# Patient Record
Sex: Female | Born: 1985 | Race: White | Hispanic: No | Marital: Married | Smoking: Never smoker
Health system: Southern US, Community
[De-identification: ages and names within clinical notes are randomized; demographics above are authoritative.]

## PROBLEM LIST (undated history)

## (undated) DIAGNOSIS — G43909 Migraine, unspecified, not intractable, without status migrainosus: Secondary | ICD-10-CM

## (undated) DIAGNOSIS — F329 Major depressive disorder, single episode, unspecified: Secondary | ICD-10-CM

## (undated) DIAGNOSIS — J45909 Unspecified asthma, uncomplicated: Secondary | ICD-10-CM

## (undated) DIAGNOSIS — Z8632 Personal history of gestational diabetes: Secondary | ICD-10-CM

## (undated) DIAGNOSIS — M1990 Unspecified osteoarthritis, unspecified site: Secondary | ICD-10-CM

## (undated) DIAGNOSIS — F32A Depression, unspecified: Secondary | ICD-10-CM

## (undated) HISTORY — PX: NO PAST SURGERIES: SHX2092

## (undated) HISTORY — DX: Unspecified osteoarthritis, unspecified site: M19.90

## (undated) HISTORY — DX: Unspecified asthma, uncomplicated: J45.909

## (undated) HISTORY — DX: Depression, unspecified: F32.A

## (undated) HISTORY — DX: Migraine, unspecified, not intractable, without status migrainosus: G43.909

## (undated) HISTORY — DX: Personal history of gestational diabetes: Z86.32

---
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
## 2019-11-30 NOTE — L&D Delivery Note (Signed)
Patient: Nicole Noble MRN: [id number]  GBS status: Neg, IAP given: None   Patient is a 34 y.o. now G4P3 s/p NSVD at [redacted]w[redacted]d, who was admitted for IOL for Polyhydramnios. AROM 1h 20m prior to delivery with clear fluid.    Delivery Note At 11:27 AM a viable female was delivered via Vaginal, Spontaneous (Presentation: Left Occiput Anterior).  APGAR: 9, 9; weight pending.   Placenta status: Spontaneous, Intact.  Cord: 3 vessels with the following complications: None.    Anesthesia: Epidural Episiotomy: None Lacerations: 1st degree;Perineal Suture Repair: 3.0 vicryl rapide Est. Blood Loss (mL):  264   Head delivered LOA with compound hand. No nuchal cord present. Shoulder and body delivered in usual fashion. Infant with spontaneous cry, placed on mother's abdomen, dried and bulb suctioned. Cord clamped x 2 after 1-minute delay, and cut by family member. Cord blood drawn. Placenta delivered spontaneously with gentle cord traction. Fundus firm with massage and Pitocin. Perineum inspected and found to have 1st degree laceration, which was repaired with 3.0 vicryl rapide with good hemostasis achieved.  Mom to postpartum.  Baby to Couplet care / Skin to Skin.  De Hollingshead 07/09/2020, 11:49 AM

## 2020-04-15 ENCOUNTER — Encounter: Payer: Self-pay | Admitting: *Deleted

## 2020-04-22 ENCOUNTER — Encounter: Payer: Self-pay | Admitting: Obstetrics & Gynecology

## 2020-04-22 DIAGNOSIS — Z3480 Encounter for supervision of other normal pregnancy, unspecified trimester: Secondary | ICD-10-CM | POA: Insufficient documentation

## 2020-04-24 ENCOUNTER — Ambulatory Visit (INDEPENDENT_AMBULATORY_CARE_PROVIDER_SITE_OTHER): Payer: Self-pay | Admitting: Obstetrics & Gynecology

## 2020-04-24 ENCOUNTER — Other Ambulatory Visit: Payer: Self-pay

## 2020-04-24 ENCOUNTER — Encounter: Payer: Self-pay | Admitting: Obstetrics & Gynecology

## 2020-04-24 VITALS — BP 115/66 | HR 70 | Ht 60.0 in | Wt 169.0 lb

## 2020-04-24 DIAGNOSIS — Z23 Encounter for immunization: Secondary | ICD-10-CM

## 2020-04-24 DIAGNOSIS — Z3480 Encounter for supervision of other normal pregnancy, unspecified trimester: Secondary | ICD-10-CM

## 2020-04-24 DIAGNOSIS — Z3A28 28 weeks gestation of pregnancy: Secondary | ICD-10-CM

## 2020-04-24 DIAGNOSIS — Z3483 Encounter for supervision of other normal pregnancy, third trimester: Secondary | ICD-10-CM

## 2020-04-24 MED ORDER — ASPIRIN EC 81 MG PO TBEC: 81.0000 mg | DELAYED_RELEASE_TABLET | Freq: Every day | ORAL | 2 refills | Status: DC

## 2020-04-24 NOTE — Progress Notes (Signed)
   PRENATAL VISIT NOTE  Subjective:  Nicole Noble is a 34 y.o. [id number] at [redacted]w[redacted]d being seen today for transfer of prenatal care from [place].  No issues so far this pregnany, history of GDM.  Had normal early GTT.  Had two term SVDs. She is currently monitored for the following issues for this low-risk pregnancy and has Supervision of other normal pregnancy, antepartum on their problem list.  Patient reports no complaints.  Contractions: Not present. Vag. Bleeding: None.  Movement: Present. Denies leaking of fluid.   The following portions of the patient's history were reviewed and updated as appropriate: allergies, current medications, past family history, past medical history, past social history, past surgical history and problem list.   Objective:   Vitals:   04/22/20 1557 04/24/20 1411  BP:  115/66  Pulse:  70  Weight:  169 lb (76.7 kg)  Height: 5' (1.524 m)     Fetal Status: Fetal Heart Rate (bpm): 145   Movement: Present     General:  Alert, oriented and cooperative. Patient is in no acute distress.  Skin: Skin is warm and dry. No rash noted.   Cardiovascular: Normal heart rate noted  Respiratory: Normal respiratory effort, no problems with respiration noted  Abdomen: Soft, gravid, appropriate for gestational age.  Pain/Pressure: Present     Pelvic: Cervical exam deferred        Extremities: Normal range of motion.  Edema: None  Mental Status: Normal mood and affect. Normal behavior. Normal judgment and thought content.   Assessment and Plan:  Pregnancy: G4P2012 at [redacted]w[redacted]d 1. [redacted] weeks gestation of pregnancy 2. Supervision of other normal pregnancy, antepartum - Tdap vaccine greater than or equal to 7yo IM - aspirin EC 81 MG tablet; Take 1 tablet (81 mg total) by mouth daily. Take after 12 weeks for prevention of preeclampsia later in pregnancy  Dispense: 300 tablet; Refill: 2 - She is interested in waterbirth, information given to her. Will discuss with CNM next visit.  -  The nature of [facility] with multiple MDs and other Advanced Practice Providers was explained to patient; also emphasized that residents, students are part of our team.  Preterm labor symptoms and general obstetric precautions including but not limited to vaginal bleeding, contractions, leaking of fluid and fetal movement were reviewed in detail with the patient. Please refer to After Visit Summary for other counseling recommendations.   Return in about 2 weeks (around 05/08/2020) for 2 hr GTT, 3rd trimester labs, OFFICE OB Visit with CNM to discuss waterbirth.  Future Appointments  Date Time Provider Department Center  05/13/2020 11:15 AM Leftwich-Kirby, Wilmer Floor, CNM [facility]    Jaynie Collins, MD

## 2020-04-24 NOTE — Patient Instructions (Signed)
Return to office for any scheduled appointments. Call the office or go to the MAU at [facility] if:  You begin to have strong, frequent contractions  Your water breaks.  Sometimes it is a big gush of fluid, sometimes it is just a trickle that keeps getting your panties wet or running down your legs  You have vaginal bleeding.  It is normal to have a small amount of spotting if your cervix was checked.   You do not feel your baby moving like normal.  If you do not, get something to eat and drink and lay down and focus on feeling your baby move.   If your baby is still not moving like normal, you should call the office or go to MAU.  Any other obstetric concerns.  Considering Waterbirth? Guide for patients at [facility] Why consider waterbirth? . Gentle birth for babies  . Less pain medicine used in labor  . May allow for passive descent/less pushing  . May reduce perineal tears  . More mobility and instinctive maternal position changes  . Increased maternal relaxation  . Reduced blood pressure in labor   Is waterbirth safe? What are the risks of infection, drowning or other complications? . Infection:  Marland Kitchen Very low risk (3.7 % for tub vs 4.8% for bed)  . 7 in 8000 waterbirths with documented infection  . Poorly cleaned equipment most common cause  . Slightly lower group B strep transmission rate  . Drowning  . Maternal:  . Very low risk  . Related to seizures or fainting  . Newborn:  Marland Kitchen Very low risk. No evidence of increased risk of respiratory problems in multiple large studies  . Physiological protection from breathing under water  . Avoid underwater birth if there are any fetal complications  . Once baby's head is out of the water, keep it out.  . Birth complication  . Some reports of cord trauma, but risk decreased by bringing baby to surface gradually  . No evidence of increased risk of shoulder dystocia. Mothers can usually  change positions faster in water than in a bed, possibly aiding the maneuvers to free the shoulder.  ? You must attend a Waterbirth class at [employer] at [facility] . 3rd Wednesday of every month from 7-9pm  . Free  . Register by calling [phone] or online at [email or web address]  . Bring [place] the certificate from the class to your prenatal appointment  Meet with a midwife at 36 weeks to see if you can still plan a waterbirth and to sign the consent.   If you plan a waterbirth at [facility] at [facility], you can opt to purchase the following: . Fish Net . Bathing suit top (optional)  . Long-handled mirror (optional)  .  Things that would prevent you from having a waterbirth: . Unknown or Positive COVID-19 diagnosis upon admission to hospital  . Premature, <37wks  . Previous cesarean birth  . Presence of thick meconium-stained fluid  . Multiple gestation (Twins, triplets, etc.)  . Uncontrolled diabetes or gestational diabetes requiring medication  . Hypertension requiring medication or diagnosis of pre-eclampsia  . Heavy vaginal bleeding  . Non-reassuring fetal heart rate  . Active infection (MRSA, etc.). Group B Strep is NOT a contraindication for waterbirth.  . If your labor has to be induced and induction method requires continuous monitoring of the baby's heart rate  . Other risks/issues  identified by your obstetrical provider  Please remember that birth is unpredictable. Under certain unforeseeable circumstances your provider may advise against giving birth in the tub. These decisions will be made on a case-by-case basis and with the safety of you and your baby as our highest priority.  **Please remember that in order to have a waterbirth, you must test Negative to COVID-19 upon admission to the hospital.**  Third Trimester of Pregnancy The third trimester is from week 28 through week 40 (months 7 through 9). The third trimester is  a time when the unborn baby (fetus) is growing rapidly. At the end of the ninth month, the fetus is about 20 inches in length and weighs 6-10 pounds. Body changes during your third trimester Your body will continue to go through many changes during pregnancy. The changes vary from woman to woman. During the third trimester:  Your weight will continue to increase. You can expect to gain 25-35 pounds (11-16 kg) by the end of the pregnancy.  You may begin to get stretch marks on your hips, abdomen, and breasts.  You may urinate more often because the fetus is moving lower into your pelvis and pressing on your bladder.  You may develop or continue to have heartburn. This is caused by increased hormones that slow down muscles in the digestive tract.  You may develop or continue to have constipation because increased hormones slow digestion and cause the muscles that push waste through your intestines to relax.  You may develop hemorrhoids. These are swollen veins (varicose veins) in the rectum that can itch or be painful.  You may develop swollen, bulging veins (varicose veins) in your legs.  You may have increased body aches in the pelvis, back, or thighs. This is due to weight gain and increased hormones that are relaxing your joints.  You may have changes in your hair. These can include thickening of your hair, rapid growth, and changes in texture. Some women also have hair loss during or after pregnancy, or hair that feels dry or thin. Your hair will most likely return to normal after your baby is born.  Your breasts will continue to grow and they will continue to become tender. A yellow fluid (colostrum) may leak from your breasts. This is the first milk you are producing for your baby.  Your belly button may stick out.  You may notice more swelling in your hands, face, or ankles.  You may have increased tingling or numbness in your hands, arms, and legs. The skin on your belly may also  feel numb.  You may feel short of breath because of your expanding uterus.  You may have more problems sleeping. This can be caused by the size of your belly, increased need to urinate, and an increase in your body's metabolism.  You may notice the fetus "dropping," or moving lower in your abdomen (lightening).  You may have increased vaginal discharge.  You may notice your joints feel loose and you may have pain around your pelvic bone. What to expect at prenatal visits You will have prenatal exams every 2 weeks until week 36. Then you will have weekly prenatal exams. During a routine prenatal visit:  You will be weighed to make sure you and the baby are growing normally.  Your blood pressure will be taken.  Your abdomen will be measured to track your baby's growth.  The fetal heartbeat will be listened to.  Any test results from the previous visit will be  discussed.  You may have a cervical check near your due date to see if your cervix has softened or thinned (effaced).  You will be tested for Group B streptococcus. This happens between 35 and 37 weeks. Your health care provider may ask you:  What your birth plan is.  How you are feeling.  If you are feeling the baby move.  If you have had any abnormal symptoms, such as leaking fluid, bleeding, severe headaches, or abdominal cramping.  If you are using any tobacco products, including cigarettes, chewing tobacco, and electronic cigarettes.  If you have any questions. Other tests or screenings that may be performed during your third trimester include:  Blood tests that check for low iron levels (anemia).  Fetal testing to check the health, activity level, and growth of the fetus. Testing is done if you have certain medical conditions or if there are problems during the pregnancy.  Nonstress test (NST). This test checks the health of your baby to make sure there are no signs of problems, such as the baby not getting  enough oxygen. During this test, a belt is placed around your belly. The baby is made to move, and its heart rate is monitored during movement. What is false labor? False labor is a condition in which you feel small, irregular tightenings of the muscles in the womb (contractions) that usually go away with rest, changing position, or drinking water. These are called Braxton Hicks contractions. Contractions may last for hours, days, or even weeks before true labor sets in. If contractions come at regular intervals, become more frequent, increase in intensity, or become painful, you should see your health care provider. What are the signs of labor?  Abdominal cramps.  Regular contractions that start at 10 minutes apart and become stronger and more frequent with time.  Contractions that start on the top of the uterus and spread down to the lower abdomen and back.  Increased pelvic pressure and dull back pain.  A watery or bloody mucus discharge that comes from the vagina.  Leaking of amniotic fluid. This is also known as your "water breaking." It could be a slow trickle or a gush. Let your health care provider know if it has a color or strange odor. If you have any of these signs, call your health care provider right away, even if it is before your due date. Follow these instructions at home: Medicines  Follow your health care provider's instructions regarding medicine use. Specific medicines may be either safe or unsafe to take during pregnancy.  Take a prenatal vitamin that contains at least 600 micrograms (mcg) of folic acid.  If you develop constipation, try taking a stool softener if your health care provider approves. Eating and drinking   Eat a balanced diet that includes fresh fruits and vegetables, whole grains, good sources of protein such as meat, eggs, or tofu, and low-fat dairy. Your health care provider will help you determine the amount of weight gain that is right for  you.  Avoid raw meat and uncooked cheese. These carry germs that can cause birth defects in the baby.  If you have low calcium intake from food, talk to your health care provider about whether you should take a daily calcium supplement.  Eat four or five small meals rather than three large meals a day.  Limit foods that are high in fat and processed sugars, such as fried and sweet foods.  To prevent constipation: ? Drink enough fluid to  keep your urine clear or pale yellow. ? Eat foods that are high in fiber, such as fresh fruits and vegetables, whole grains, and beans. Activity  Exercise only as directed by your health care provider. Most women can continue their usual exercise routine during pregnancy. Try to exercise for 30 minutes at least 5 days a week. Stop exercising if you experience uterine contractions.  Avoid heavy lifting.  Do not exercise in extreme heat or humidity, or at high altitudes.  Wear low-heel, comfortable shoes.  Practice good posture.  You may continue to have sex unless your health care provider tells you otherwise. Relieving pain and discomfort  Take frequent breaks and rest with your legs elevated if you have leg cramps or low back pain.  Take warm sitz baths to soothe any pain or discomfort caused by hemorrhoids. Use hemorrhoid cream if your health care provider approves.  Wear a good support bra to prevent discomfort from breast tenderness.  If you develop varicose veins: ? Wear support pantyhose or compression stockings as told by your healthcare provider. ? Elevate your feet for 15 minutes, 3-4 times a day. Prenatal care  Write down your questions. Take them to your prenatal visits.  Keep all your prenatal visits as told by your health care provider. This is important. Safety  Wear your seat belt at all times when driving.  Make a list of emergency phone numbers, including numbers for family, friends, the hospital, and police and fire  departments. General instructions  Avoid cat litter boxes and soil used by cats. These carry germs that can cause birth defects in the baby. If you have a cat, ask someone to clean the litter box for you.  Do not travel far distances unless it is absolutely necessary and only with the approval of your health care provider.  Do not use hot tubs, steam rooms, or saunas.  Do not drink alcohol.  Do not use any products that contain nicotine or tobacco, such as cigarettes and e-cigarettes. If you need help quitting, ask your health care provider.  Do not use any medicinal herbs or unprescribed drugs. These chemicals affect the formation and growth of the baby.  Do not douche or use tampons or scented sanitary pads.  Do not cross your legs for long periods of time.  To prepare for the arrival of your baby: ? Take prenatal classes to understand, practice, and ask questions about labor and delivery. ? Make a trial run to the hospital. ? Visit the hospital and tour the maternity area. ? Arrange for maternity or paternity leave through employers. ? Arrange for family and friends to take care of pets while you are in the hospital. ? Purchase a rear-facing car seat and make sure you know how to install it in your car. ? Pack your hospital bag. ? Prepare the baby's nursery. Make sure to remove all pillows and stuffed animals from the baby's crib to prevent suffocation.  Visit your dentist if you have not gone during your pregnancy. Use a soft toothbrush to brush your teeth and be gentle when you floss. Contact a health care provider if:  You are unsure if you are in labor or if your water has broken.  You become dizzy.  You have mild pelvic cramps, pelvic pressure, or nagging pain in your abdominal area.  You have lower back pain.  You have persistent nausea, vomiting, or diarrhea.  You have an unusual or bad smelling vaginal discharge.  You have pain  when you urinate. Get help right  away if:  Your water breaks before 37 weeks.  You have regular contractions less than 5 minutes apart before 37 weeks.  You have a fever.  You are leaking fluid from your vagina.  You have spotting or bleeding from your vagina.  You have severe abdominal pain or cramping.  You have rapid weight loss or weight gain.  You have shortness of breath with chest pain.  You notice sudden or extreme swelling of your face, hands, ankles, feet, or legs.  Your baby makes fewer than 10 movements in 2 hours.  You have severe headaches that do not go away when you take medicine.  You have vision changes. Summary  The third trimester is from week 28 through week 40, months 7 through 9. The third trimester is a time when the unborn baby (fetus) is growing rapidly.  During the third trimester, your discomfort may increase as you and your baby continue to gain weight. You may have abdominal, leg, and back pain, sleeping problems, and an increased need to urinate.  During the third trimester your breasts will keep growing and they will continue to become tender. A yellow fluid (colostrum) may leak from your breasts. This is the first milk you are producing for your baby.  False labor is a condition in which you feel small, irregular tightenings of the muscles in the womb (contractions) that eventually go away. These are called Braxton Hicks contractions. Contractions may last for hours, days, or even weeks before true labor sets in.  Signs of labor can include: abdominal cramps; regular contractions that start at 10 minutes apart and become stronger and more frequent with time; watery or bloody mucus discharge that comes from the vagina; increased pelvic pressure and dull back pain; and leaking of amniotic fluid. This information is not intended to replace advice given to you by your health care provider. Make sure you discuss any questions you have with your health care provider. Document Revised:  03/08/2019 Document Reviewed: 12/21/2016 Elsevier Patient Education  [place].

## 2020-05-13 ENCOUNTER — Ambulatory Visit (INDEPENDENT_AMBULATORY_CARE_PROVIDER_SITE_OTHER): Payer: 59 | Admitting: Advanced Practice Midwife

## 2020-05-13 ENCOUNTER — Other Ambulatory Visit: Payer: Self-pay

## 2020-05-13 VITALS — BP 111/65 | HR 88 | Wt 170.0 lb

## 2020-05-13 DIAGNOSIS — Z3480 Encounter for supervision of other normal pregnancy, unspecified trimester: Secondary | ICD-10-CM

## 2020-05-13 DIAGNOSIS — Z3483 Encounter for supervision of other normal pregnancy, third trimester: Secondary | ICD-10-CM

## 2020-05-13 DIAGNOSIS — Z3A30 30 weeks gestation of pregnancy: Secondary | ICD-10-CM

## 2020-05-13 NOTE — Patient Instructions (Signed)
Considering Waterbirth? °Guide for patients at [facility] °Why consider waterbirth? °• Gentle birth for babies  °• Less pain medicine used in labor  °• May allow for passive descent/less pushing  °• May reduce perineal tears  °• More mobility and instinctive maternal position changes  °• Increased maternal relaxation  °• Reduced blood pressure in labor  ° °Is waterbirth safe? What are the risks of infection, drowning or other complications? °• Infection:  °• Very low risk (3.7 % for tub vs 4.8% for bed)  °• 7 in 8000 waterbirths with documented infection  °• Poorly cleaned equipment most common cause  °• Slightly lower group B strep transmission rate  °• Drowning  °• Maternal:  °• Very low risk  °• Related to seizures or fainting  °• Newborn:  °• Very low risk. No evidence of increased risk of respiratory problems in multiple large studies  °• Physiological protection from breathing under water  °• Avoid underwater birth if there are any fetal complications  °• Once baby's head is out of the water, keep it out.  °• Birth complication  °• Some reports of cord trauma, but risk decreased by bringing baby to surface gradually  °• No evidence of increased risk of shoulder dystocia. Mothers can usually change positions faster in water than in a bed, possibly aiding the maneuvers to free the shoulder.  °? °You must attend a Waterbirth class at [facility] [place] °• 3rd Wednesday of every month from 7-9pm  °• Free  °• Register by calling [phone] or online at [email or web address]  °• Bring us the certificate from the class to your prenatal appointment  °Meet with a midwife at 36 weeks to see if you can still plan a waterbirth and to sign the consent.  ° °If you plan a waterbirth at [facility] , you can opt to purchase the following: °• Fish Net °• Bathing suit top (optional)  °• Long-handled mirror (optional)  °•  °Things that would  prevent you from having a waterbirth: °• Unknown or Positive COVID-19 diagnosis upon admission to hospital  °• Premature, <37wks  °• Previous cesarean birth  °• Presence of thick meconium-stained fluid  °• Multiple gestation (Twins, triplets, etc.)  °• Uncontrolled diabetes or gestational diabetes requiring medication  °• Hypertension requiring medication or diagnosis of pre-eclampsia  °• Heavy vaginal bleeding  °• Non-reassuring fetal heart rate  °• Active infection (MRSA, etc.). Group B Strep is NOT a contraindication for waterbirth.  °• If your labor has to be induced and induction method requires continuous monitoring of the baby's heart rate  °• Other risks/issues identified by your obstetrical provider  °Please remember that birth is unpredictable. Under certain unforeseeable circumstances your provider may advise against giving birth in the tub. These decisions will be made on a case-by-case basis and with the safety of you and your baby as our highest priority. ° °**Please remember that in order to have a waterbirth, you must test Negative to COVID-19 upon admission to the hospital.** ° °

## 2020-05-13 NOTE — Progress Notes (Signed)
   PRENATAL VISIT NOTE  Subjective:  Nicole Noble is a 34 y.o. [id number] at [redacted]w[redacted]d being seen today for ongoing prenatal care.  She is currently monitored for the following issues for this low-risk pregnancy and has Supervision of other normal pregnancy, antepartum on their problem list.  Patient reports no complaints.  Contractions: Not present. Vag. Bleeding: None.  Movement: Present. Denies leaking of fluid.   The following portions of the patient's history were reviewed and updated as appropriate: allergies, current medications, past family history, past medical history, past social history, past surgical history and problem list.   Objective:   Vitals:   05/13/20 0805  BP: 111/65  Pulse: 88  Weight: 170 lb (77.1 kg)    Fetal Status: Fetal Heart Rate (bpm): 140   Movement: Present     General:  Alert, oriented and cooperative. Patient is in no acute distress.  Skin: Skin is warm and dry. No rash noted.   Cardiovascular: Normal heart rate noted  Respiratory: Normal respiratory effort, no problems with respiration noted  Abdomen: Soft, gravid, appropriate for gestational age.  Pain/Pressure: Absent     Pelvic: Cervical exam deferred        Extremities: Normal range of motion.  Edema: None  Mental Status: Normal mood and affect. Normal behavior. Normal judgment and thought content.   Assessment and Plan:  Pregnancy: [id number] at [redacted]w[redacted]d 1. Supervision of other normal pregnancy, antepartum --Anticipatory guidance about next visits/weeks of pregnancy given. --Interest in waterbirth, discussed today, pt given consent to take home to review.  Pt to enroll in class.  Next visit in 4 weeks with CNM. - Glucose Tolerance, 2 Hours w/1 Hour - HIV antibody (with reflex) - CBC - RPR  Term labor symptoms and general obstetric precautions including but not limited to vaginal bleeding, contractions, leaking of fluid and fetal movement were reviewed in detail with the patient. Please refer to  After Visit Summary for other counseling recommendations.   Return in about 4 weeks (around 06/10/2020).  Future Appointments  Date Time Provider Department Center  05/13/2020 11:15 AM Leftwich-Kirby, Wilmer Floor, CNM [facility]    Sharen Counter, CNM

## 2020-05-14 LAB — CBC
HCT: 39.5 % (ref 35.0–45.0)
Hemoglobin: 13.4 g/dL (ref 11.7–15.5)
MCH: 30.6 pg (ref 27.0–33.0)
MCHC: 33.9 g/dL (ref 32.0–36.0)
MCV: 90.2 fL (ref 80.0–100.0)
MPV: 10.2 fL (ref 7.5–12.5)
Platelets: 197 10*3/uL (ref 140–400)
RBC: 4.38 10*6/uL (ref 3.80–5.10)
RDW: 12 % (ref 11.0–15.0)
WBC: 9.3 10*3/uL (ref 3.8–10.8)

## 2020-05-14 LAB — GLUCOSE TOLERANCE, 2 HOURS W/ 1HR
Glucose, 1 hour: 167 mg/dL (ref 65–199)
Glucose, 2 hour: 137 mg/dL (ref 65–139)
Glucose, Fasting: 79 mg/dL (ref 65–99)

## 2020-05-14 LAB — HIV ANTIBODY (ROUTINE TESTING W REFLEX): HIV 1&2 Ab, 4th Generation: NONREACTIVE

## 2020-05-14 LAB — RPR: RPR Ser Ql: NONREACTIVE

## 2020-06-09 ENCOUNTER — Other Ambulatory Visit: Payer: Self-pay

## 2020-06-09 ENCOUNTER — Encounter: Payer: Self-pay | Admitting: Obstetrics and Gynecology

## 2020-06-09 ENCOUNTER — Ambulatory Visit (INDEPENDENT_AMBULATORY_CARE_PROVIDER_SITE_OTHER): Payer: 59 | Admitting: Obstetrics and Gynecology

## 2020-06-09 VITALS — BP 117/74 | HR 101 | Wt 172.0 lb

## 2020-06-09 DIAGNOSIS — Z3A34 34 weeks gestation of pregnancy: Secondary | ICD-10-CM

## 2020-06-09 DIAGNOSIS — O26843 Uterine size-date discrepancy, third trimester: Secondary | ICD-10-CM

## 2020-06-09 DIAGNOSIS — Z3480 Encounter for supervision of other normal pregnancy, unspecified trimester: Secondary | ICD-10-CM

## 2020-06-09 MED ORDER — MISC. DEVICES MISC
0 refills | Status: DC
Start: 2020-06-09 — End: 2020-06-09

## 2020-06-09 MED ORDER — MISC. DEVICES MISC: 0 refills | Status: DC

## 2020-06-09 NOTE — Progress Notes (Signed)
   PRENATAL VISIT NOTE  Subjective:  Rosilyn Coachman is a 34 y.o. [id number] at [redacted]w[redacted]d being seen today for ongoing prenatal care.  She is currently monitored for the following issues for this low-risk pregnancy and has Supervision of other normal pregnancy, antepartum on their problem list.  Patient reports occasional contractions, some cramping.  Contractions: Irritability. Vag. Bleeding: None.  Movement: Present. Denies leaking of fluid.   The following portions of the patient's history were reviewed and updated as appropriate: allergies, current medications, past family history, past medical history, past social history, past surgical history and problem list.   Objective:   Vitals:   06/09/20 0929  BP: 117/74  Pulse: (!) 101  Weight: 172 lb (78 kg)    Fetal Status: Fetal Heart Rate (bpm): 149   Movement: Present     General:  Alert, oriented and cooperative. Patient is in no acute distress.  Skin: Skin is warm and dry. No rash noted.   Cardiovascular: Normal heart rate noted  Respiratory: Normal respiratory effort, no problems with respiration noted  Abdomen: Soft, gravid, appropriate for gestational age.  Pain/Pressure: Present     Pelvic: Cervical exam deferred        Extremities: Normal range of motion.  Edema: None  Mental Status: Normal mood and affect. Normal behavior. Normal judgment and thought content.   Assessment and Plan:  Pregnancy: [id number] at [redacted]w[redacted]d  1. Supervision of other normal pregnancy, antepartum Scheduled to take waterbirth class this week Next visit with CNM  2. Significant discrepancy between uterine size and clinical dates, antepartum, third trimester Size > dates - [place] MFM OB COMP + 14 WK; Future   Preterm labor symptoms and general obstetric precautions including but not limited to vaginal bleeding, contractions, leaking of fluid and fetal movement were reviewed in detail with the patient. Please refer to After Visit Summary for other counseling  recommendations.   Return in about 2 weeks (around 06/23/2020) for low OB, in person.  No future appointments.  Conan Bowens, MD

## 2020-06-09 NOTE — Addendum Note (Signed)
Addended by: Granville Lewis on: 06/09/2020 10:07 AM   Modules accepted: Orders

## 2020-06-10 ENCOUNTER — Encounter: Payer: 59 | Admitting: Advanced Practice Midwife

## 2020-06-20 ENCOUNTER — Other Ambulatory Visit: Payer: Self-pay

## 2020-06-20 ENCOUNTER — Ambulatory Visit: Payer: 59 | Admitting: *Deleted

## 2020-06-20 ENCOUNTER — Ambulatory Visit: Payer: 59 | Attending: Obstetrics and Gynecology

## 2020-06-20 ENCOUNTER — Other Ambulatory Visit: Payer: Self-pay | Admitting: *Deleted

## 2020-06-20 ENCOUNTER — Encounter: Payer: Self-pay | Admitting: Obstetrics and Gynecology

## 2020-06-20 VITALS — BP 113/77 | HR 90

## 2020-06-20 DIAGNOSIS — O409XX0 Polyhydramnios, unspecified trimester, not applicable or unspecified: Secondary | ICD-10-CM

## 2020-06-20 DIAGNOSIS — O26843 Uterine size-date discrepancy, third trimester: Secondary | ICD-10-CM | POA: Diagnosis present

## 2020-06-20 DIAGNOSIS — Z3480 Encounter for supervision of other normal pregnancy, unspecified trimester: Secondary | ICD-10-CM | POA: Diagnosis present

## 2020-06-20 DIAGNOSIS — O0990 Supervision of high risk pregnancy, unspecified, unspecified trimester: Secondary | ICD-10-CM | POA: Insufficient documentation

## 2020-06-20 DIAGNOSIS — Z3A36 36 weeks gestation of pregnancy: Secondary | ICD-10-CM | POA: Diagnosis not present

## 2020-06-20 DIAGNOSIS — Z363 Encounter for antenatal screening for malformations: Secondary | ICD-10-CM

## 2020-06-20 DIAGNOSIS — O403XX1 Polyhydramnios, third trimester, fetus 1: Secondary | ICD-10-CM | POA: Diagnosis not present

## 2020-06-20 NOTE — Procedures (Signed)
Nicole Noble April 29, 1986 [redacted]w[redacted]d  Fetus A Non-Stress Test Interpretation for 06/20/20  Indication: Polyhydramnios  Fetal Heart Rate A Mode: External Baseline Rate (A): 130 bpm Variability: Moderate Accelerations: 15 x 15 Decelerations: None Multiple birth?: No  Uterine Activity Mode: Palpation, Toco Contraction Frequency (min): none noted Resting Tone Palpated: Relaxed Resting Time: Adequate  Interpretation (Fetal Testing) Nonstress Test Interpretation: Reactive Comments: Reviewed tracing with Dr. Grace Bushy

## 2020-06-27 ENCOUNTER — Other Ambulatory Visit (HOSPITAL_COMMUNITY)
Admission: RE | Admit: 2020-06-27 | Discharge: 2020-06-27 | Disposition: A | Discharge status: Home / Self Care | Payer: 59 | Source: Ambulatory Visit

## 2020-06-27 ENCOUNTER — Ambulatory Visit (INDEPENDENT_AMBULATORY_CARE_PROVIDER_SITE_OTHER): Payer: 59

## 2020-06-27 ENCOUNTER — Ambulatory Visit (HOSPITAL_BASED_OUTPATIENT_CLINIC_OR_DEPARTMENT_OTHER): Payer: 59

## 2020-06-27 ENCOUNTER — Other Ambulatory Visit: Payer: Self-pay

## 2020-06-27 ENCOUNTER — Ambulatory Visit: Payer: 59 | Admitting: *Deleted

## 2020-06-27 ENCOUNTER — Encounter: Payer: Self-pay | Admitting: *Deleted

## 2020-06-27 VITALS — BP 125/78 | HR 85 | Wt 177.0 lb

## 2020-06-27 DIAGNOSIS — Z3480 Encounter for supervision of other normal pregnancy, unspecified trimester: Secondary | ICD-10-CM

## 2020-06-27 DIAGNOSIS — O403XX0 Polyhydramnios, third trimester, not applicable or unspecified: Secondary | ICD-10-CM

## 2020-06-27 DIAGNOSIS — Z3A37 37 weeks gestation of pregnancy: Secondary | ICD-10-CM | POA: Diagnosis not present

## 2020-06-27 DIAGNOSIS — O409XX0 Polyhydramnios, unspecified trimester, not applicable or unspecified: Secondary | ICD-10-CM | POA: Insufficient documentation

## 2020-06-27 LAB — OB RESULTS CONSOLE GBS: GBS: NEGATIVE

## 2020-06-27 IMAGING — US US MFM FETAL BPP W/O NON-STRESS
1 series · 15 of 28 positions shown · non-contrast
Comparison: none

[Series 1: us mfm fetal bpp w/o non-stress · 39 acquisitions, 15 frames shown]
[im 1/39]
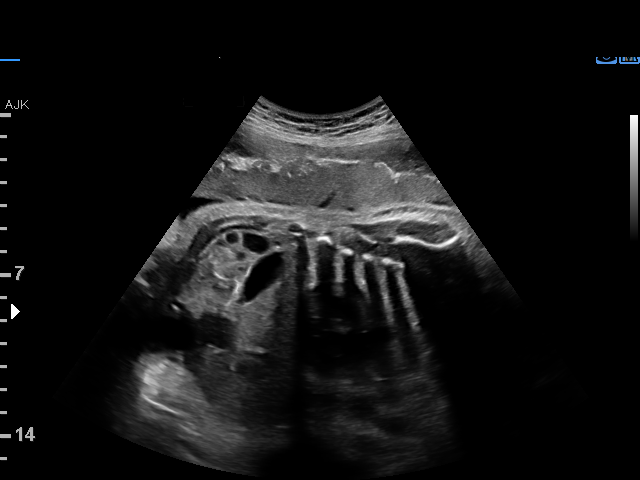
[im 3/39]
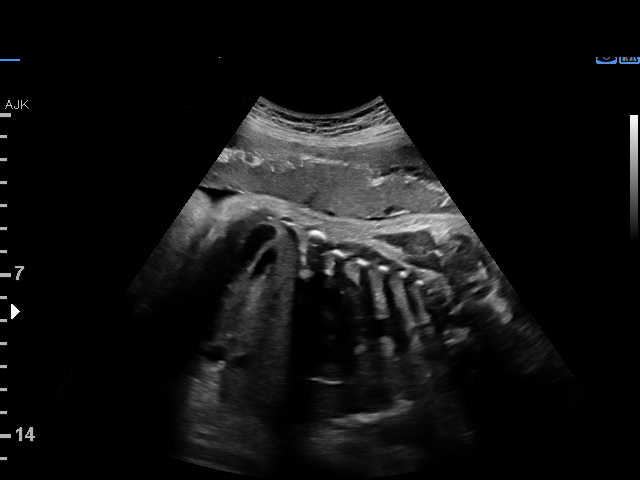
[im 6/39]
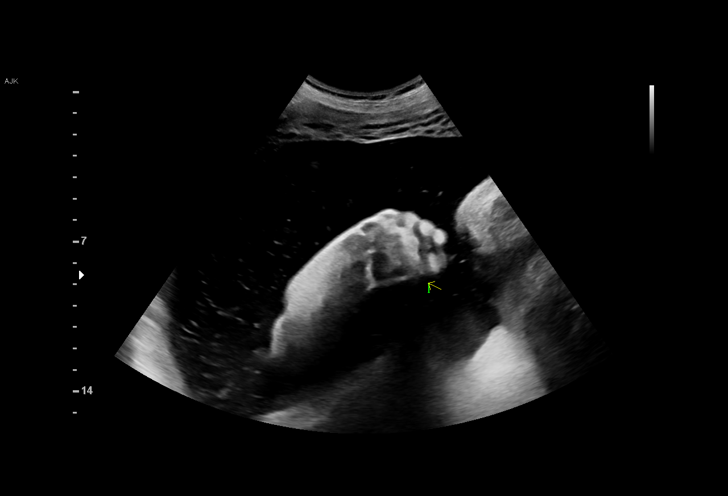
[im 9/39]
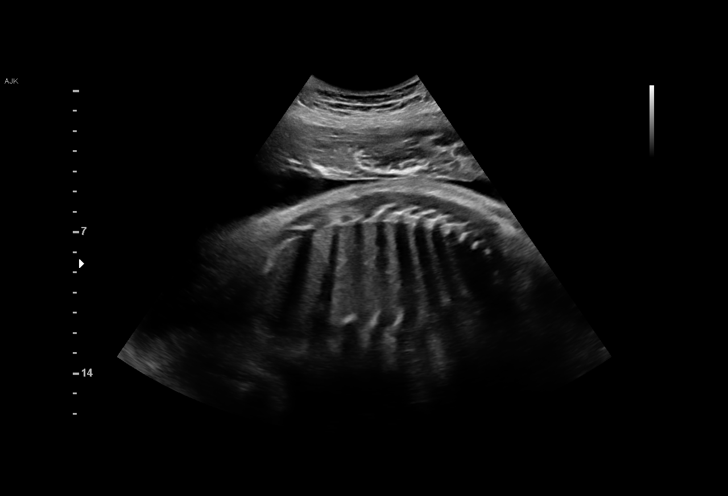
[im 12/39]
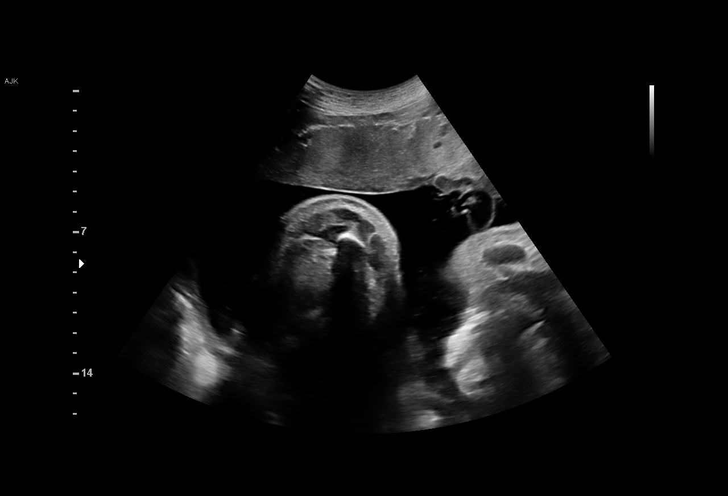
[im 15/39]
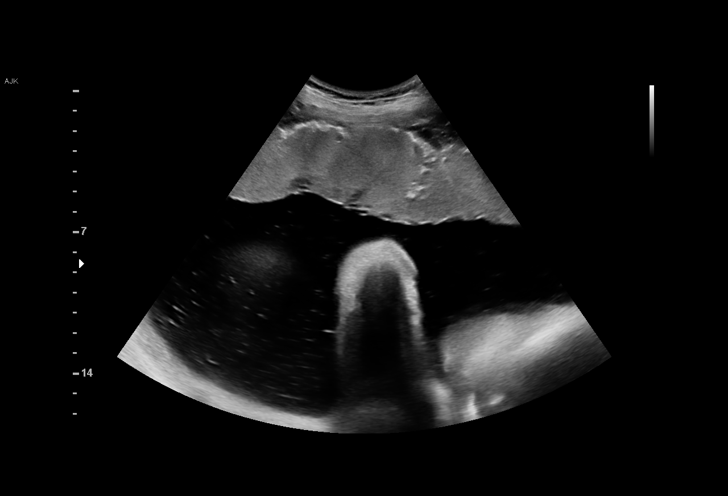
[im 17/39]
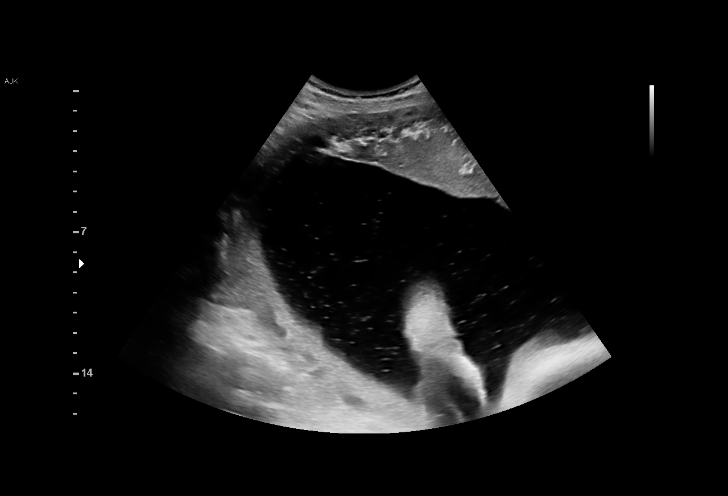
[im 20/39]
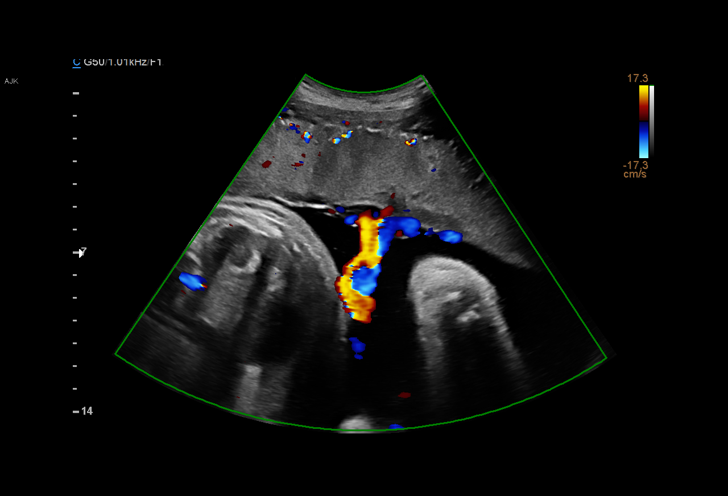
[im 22/39]
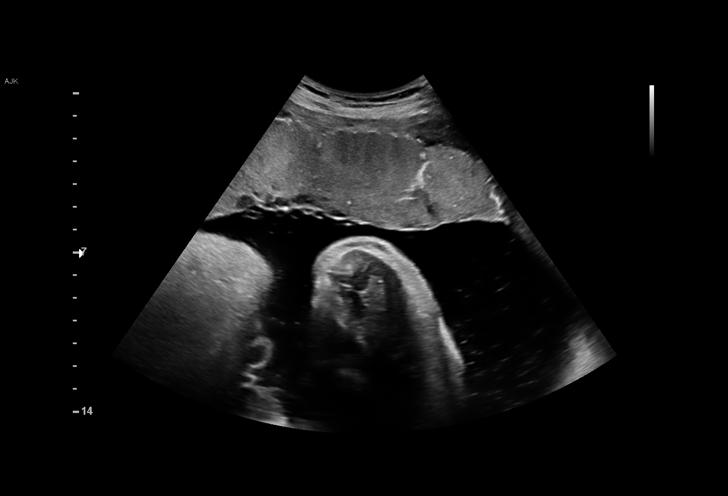
[im 24/39]
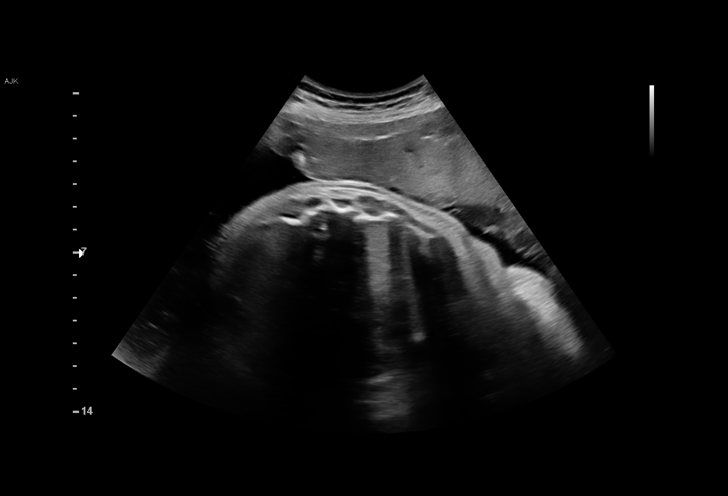
[im 27/39]
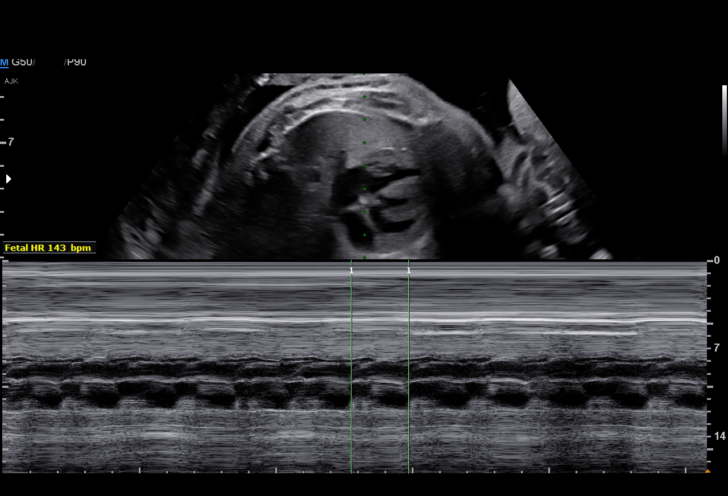
[im 30/39]
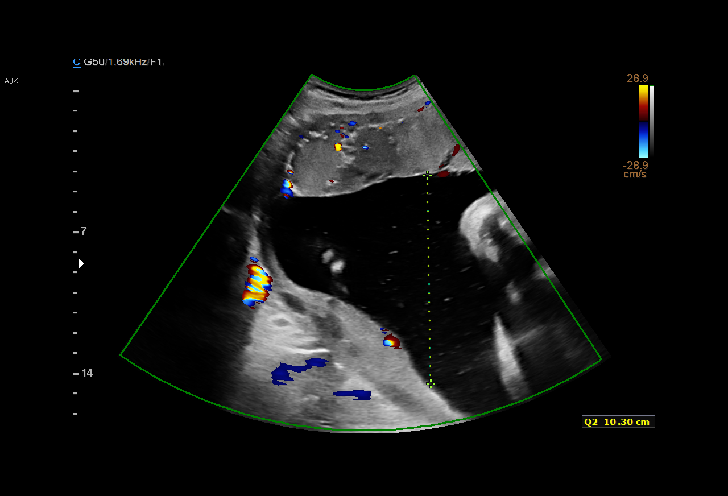
[im 33/39]
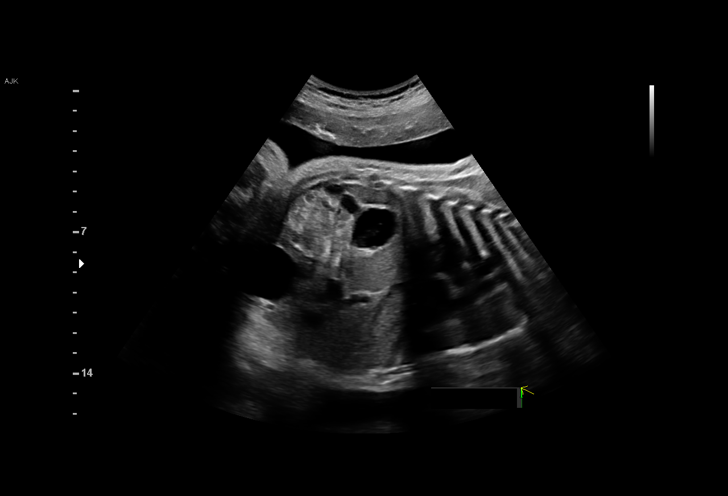
[im 36/39]
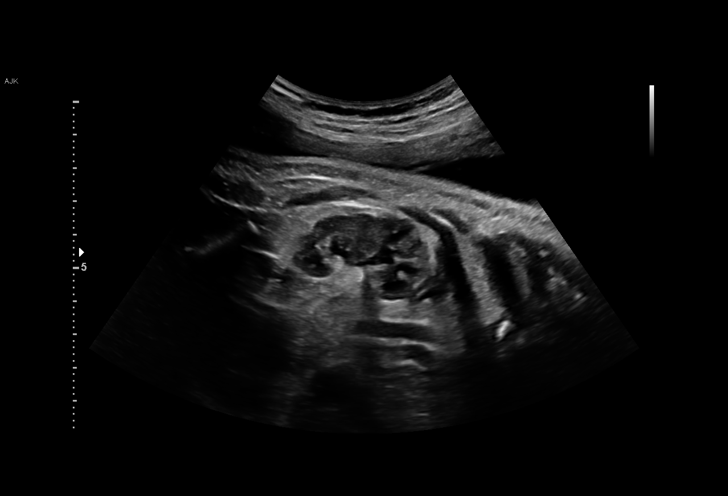
[im 39/39]
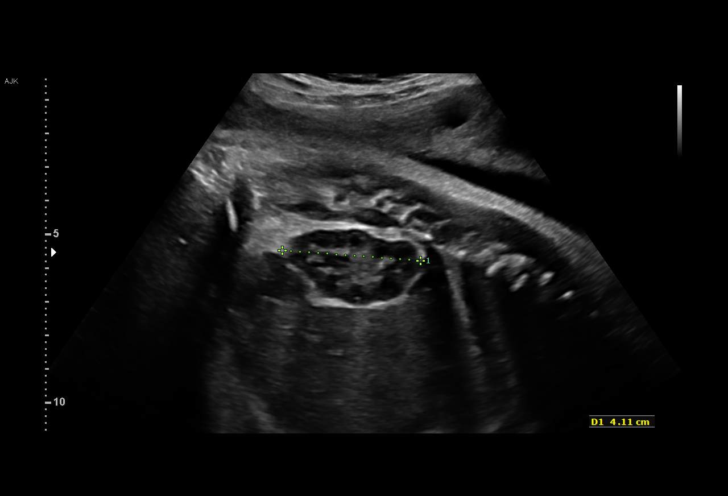

[15 of 28 positions shown; findings below may reference images not displayed]

Indications

 Polyhydramnios, third trimester, antepartum    [5L]
 condition or complication, fetus 1
 37 weeks gestation of pregnancy
Fetal Evaluation

 Num Of Fetuses:         1
 Fetal Heart Rate(bpm):  143
 Cardiac Activity:       Observed
 Presentation:           Cephalic
 Placenta:               Anterior
 P. Cord Insertion:      Visualized, central

 Amniotic Fluid
 AFI FV:      Polyhydramnios

 AFI Sum(cm)     %Tile       Largest Pocket(cm)
 33.6            > 97

 RUQ(cm)       RLQ(cm)       LUQ(cm)        LLQ(cm)


 Comment:    Stomach, bladder, kidneys, and diaphragm seen.
Biophysical Evaluation

 Amniotic F.V:   Polyhydramnios             F. Tone:        Observed
 F. Movement:    Observed                   Score:          [DATE]
 F. Breathing:   Observed
OB History

 Gravidity:    4         Term:   2         SAB:   1
 Living:       2
Gestational Age

 LMP:           39w 4d        Date:  [DATE]                 EDD:   [DATE]
 Best:          37w 2d     Det. By:  Early Ultrasound         EDD:   [DATE]
                                     ([DATE])
Comments

 This patient was seen for a biophysical profile due to
 polyhydramnios noted on her prior ultrasound exam.  The
 cause of polyhydramnios noted in her current pregnancy
 remains undetermined.  She denies any problems since her
 last exam.
 A biophysical profile performed today was [DATE].
 Polyhydramnios with a total AFI of over 33 cm continues to
 be noted.  The AFI is similar to what was noted on her
 ultrasound last week.
 The patient reports that she already has an induction of labor
 scheduled on [DATE].
 She will return next week for another biophysical profile.

## 2020-06-27 NOTE — Patient Instructions (Addendum)
Safe Medications in Pregnancy   Acne: Benzoyl Peroxide Salicylic Acid  Backache/Headache: Tylenol: 2 regular strength every 4 hours OR              2 Extra strength every 6 hours  Colds/Coughs/Allergies: Benadryl (alcohol free) 25 mg every 6 hours as needed Breath right strips Claritin Cepacol throat lozenges Chloraseptic throat spray Cold-Eeze- up to three times per day Cough drops, alcohol free Flonase (by prescription only) Guaifenesin Mucinex Robitussin DM (plain only, alcohol free) Saline nasal spray/drops Sudafed (pseudoephedrine) & Actifed ** use only after [redacted] weeks gestation and if you do not have high blood pressure Tylenol Vicks Vaporub Zinc lozenges Zyrtec   Constipation: Colace Ducolax suppositories Fleet enema Glycerin suppositories Metamucil Milk of magnesia Miralax Senokot Smooth move tea  Diarrhea: Kaopectate Imodium A-D  *NO pepto Bismol  Hemorrhoids: Anusol Anusol HC Preparation H Tucks  Indigestion: Tums Maalox Mylanta Zantac  Pepcid  Insomnia: Benadryl (alcohol free) 25mg  every 6 hours as needed Tylenol PM Unisom, no Gelcaps  Leg Cramps: Tums MagGel  Nausea/Vomiting:  Bonine Dramamine Emetrol Ginger extract Sea bands Meclizine  Nausea medication to take during pregnancy:  Unisom (doxylamine succinate 25 mg tablets) Take one tablet daily at bedtime. If symptoms are not adequately controlled, the dose can be increased to a maximum recommended dose of two tablets daily (1/2 tablet in the morning, 1/2 tablet mid-afternoon and one at bedtime). Vitamin B6 100mg  tablets. Take one tablet twice a day (up to 200 mg per day).  Skin Rashes: Aveeno products Benadryl cream or 25mg  every 6 hours as needed Calamine Lotion 1% cortisone cream  Yeast infection: Gyne-lotrimin 7 Monistat 7   **If taking multiple medications, please check labels to avoid duplicating the same active ingredients **take medication as directed on  the label ** Do not exceed 4000 mg of tylenol in 24 hours **Do not take medications that contain aspirin or ibuprofen      MILES CIRCUIT- at home or in labor Spinning babies   Red raspberry leaf tea Evening primrose oil

## 2020-06-27 NOTE — Progress Notes (Signed)
   PRENATAL VISIT NOTE  Subjective:  Nicole Noble is a 34 y.o. [id number] at [redacted]w[redacted]d being seen today for ongoing prenatal care.  She is currently monitored for the following issues for this high-risk pregnancy and has Supervision of other normal pregnancy, antepartum and Polyhydramnios on their problem list.  Patient reports no complaints.  Contractions: Irritability. Vag. Bleeding: None.  Movement: Present. Denies leaking of fluid.   The following portions of the patient's history were reviewed and updated as appropriate: allergies, current medications, past family history, past medical history, past social history, past surgical history and problem list.   Objective:   Vitals:   06/27/20 0805  BP: 125/78  Pulse: 85  Weight: 177 lb (80.3 kg)    Fetal Status: Fetal Heart Rate (bpm): 139 Fundal Height: 39 cm Movement: Present     General:  Alert, oriented and cooperative. Patient is in no acute distress.  Skin: Skin is warm and dry. No rash noted.   Cardiovascular: Normal heart rate noted  Respiratory: Normal respiratory effort, no problems with respiration noted  Abdomen: Soft, gravid, appropriate for gestational age.  Pain/Pressure: Present     Pelvic: Cervical exam performed in the presence of a chaperone Dilation: 1.5 Effacement (%): 50 Station: Ballotable  Extremities: Normal range of motion.  Edema: None  Mental Status: Normal mood and affect. Normal behavior. Normal judgment and thought content.   Assessment and Plan:  Pregnancy: [id number] at [redacted]w[redacted]d 1. Supervision of other normal pregnancy, antepartum - No complaints. Routine care - Waterbirth class completed and certificate obtained. Reviewed consent at length and all questions answered. Discussed waterbirth in setting on IOL and contraindications.  - Cervicovaginal ancillary only( [facility]) - Culture, Grp B Strep w/Rflx Suscept  2. Polyhydramnios in third trimester complication, single or unspecified fetus -AFI 33cm on  7/23 -[place] BPP with AFI scheduled today -IOL scheduled 8/10 per MFM recommendations  Term labor symptoms and general obstetric precautions including but not limited to vaginal bleeding, contractions, leaking of fluid and fetal movement were reviewed in detail with the patient. Please refer to After Visit Summary for other counseling recommendations.   Return in about 1 week (around 07/04/2020) for Return OB visit.  Future Appointments  Date Time Provider Department Center  06/27/2020  2:30 PM [facility] NURSE [facility]  06/27/2020  2:45 PM [facility]  07/04/2020 12:30 PM [facility] NURSE [facility]  07/04/2020 12:45 PM [facility]    Rolm Bookbinder, CNM 06/27/20 8:37 AM

## 2020-06-30 ENCOUNTER — Telehealth (HOSPITAL_COMMUNITY): Payer: Self-pay | Admitting: *Deleted

## 2020-06-30 ENCOUNTER — Encounter (HOSPITAL_COMMUNITY): Payer: Self-pay | Admitting: *Deleted

## 2020-06-30 LAB — CERVICOVAGINAL ANCILLARY ONLY
Chlamydia: NEGATIVE
Comment: NEGATIVE
Comment: NORMAL
Neisseria Gonorrhea: NEGATIVE

## 2020-06-30 LAB — CULTURE, STREPTOCOCCUS GRP B W/SUSCEPT
MICRO NUMBER:: 10770018
SPECIMEN QUALITY:: ADEQUATE

## 2020-06-30 NOTE — Telephone Encounter (Signed)
Preadmission screen  

## 2020-07-04 ENCOUNTER — Other Ambulatory Visit: Payer: Self-pay

## 2020-07-04 ENCOUNTER — Encounter: Payer: Self-pay | Admitting: *Deleted

## 2020-07-04 ENCOUNTER — Ambulatory Visit: Payer: 59 | Admitting: *Deleted

## 2020-07-04 ENCOUNTER — Ambulatory Visit: Payer: 59 | Attending: Obstetrics and Gynecology

## 2020-07-04 DIAGNOSIS — Z3480 Encounter for supervision of other normal pregnancy, unspecified trimester: Secondary | ICD-10-CM | POA: Insufficient documentation

## 2020-07-04 DIAGNOSIS — O409XX0 Polyhydramnios, unspecified trimester, not applicable or unspecified: Secondary | ICD-10-CM | POA: Insufficient documentation

## 2020-07-04 DIAGNOSIS — Z3A38 38 weeks gestation of pregnancy: Secondary | ICD-10-CM | POA: Diagnosis not present

## 2020-07-04 DIAGNOSIS — O403XX0 Polyhydramnios, third trimester, not applicable or unspecified: Secondary | ICD-10-CM

## 2020-07-06 ENCOUNTER — Other Ambulatory Visit: Payer: Self-pay | Admitting: Advanced Practice Midwife

## 2020-07-06 ENCOUNTER — Other Ambulatory Visit: Payer: Self-pay | Admitting: Family Medicine

## 2020-07-06 NOTE — Addendum Note (Signed)
Addended by: Rolm Bookbinder on: 07/06/2020 07:50 PM   Modules accepted: Orders, SmartSet

## 2020-07-07 ENCOUNTER — Other Ambulatory Visit (HOSPITAL_COMMUNITY)
Admission: RE | Admit: 2020-07-07 | Discharge: 2020-07-07 | Disposition: A | Discharge status: Home / Self Care | Payer: 59 | Source: Ambulatory Visit | Attending: Family Medicine | Admitting: Family Medicine

## 2020-07-07 ENCOUNTER — Other Ambulatory Visit: Payer: Self-pay | Admitting: Family Medicine

## 2020-07-07 DIAGNOSIS — Z01812 Encounter for preprocedural laboratory examination: Secondary | ICD-10-CM | POA: Insufficient documentation

## 2020-07-07 DIAGNOSIS — Z20822 Contact with and (suspected) exposure to covid-19: Secondary | ICD-10-CM | POA: Insufficient documentation

## 2020-07-07 LAB — SARS CORONAVIRUS 2 (TAT 6-24 HRS): SARS Coronavirus 2: NEGATIVE

## 2020-07-08 ENCOUNTER — Other Ambulatory Visit: Payer: Self-pay

## 2020-07-08 ENCOUNTER — Encounter (HOSPITAL_COMMUNITY): Payer: Self-pay | Admitting: Family Medicine

## 2020-07-08 ENCOUNTER — Inpatient Hospital Stay (HOSPITAL_COMMUNITY)
Admission: AD | Admit: 2020-07-08 | Discharge: 2020-07-10 | DRG: 807 | Disposition: A | Discharge status: Home / Self Care | Payer: 59 | Attending: Family Medicine | Admitting: Family Medicine

## 2020-07-08 ENCOUNTER — Inpatient Hospital Stay (HOSPITAL_COMMUNITY): Payer: 59

## 2020-07-08 DIAGNOSIS — Z3480 Encounter for supervision of other normal pregnancy, unspecified trimester: Secondary | ICD-10-CM

## 2020-07-08 DIAGNOSIS — O403XX0 Polyhydramnios, third trimester, not applicable or unspecified: Secondary | ICD-10-CM | POA: Diagnosis present

## 2020-07-08 DIAGNOSIS — E669 Obesity, unspecified: Secondary | ICD-10-CM | POA: Diagnosis present

## 2020-07-08 DIAGNOSIS — Z3A39 39 weeks gestation of pregnancy: Secondary | ICD-10-CM | POA: Diagnosis not present

## 2020-07-08 DIAGNOSIS — Z20822 Contact with and (suspected) exposure to covid-19: Secondary | ICD-10-CM | POA: Diagnosis present

## 2020-07-08 DIAGNOSIS — O409XX0 Polyhydramnios, unspecified trimester, not applicable or unspecified: Secondary | ICD-10-CM | POA: Diagnosis present

## 2020-07-08 DIAGNOSIS — O99214 Obesity complicating childbirth: Secondary | ICD-10-CM | POA: Diagnosis present

## 2020-07-08 DIAGNOSIS — Z3A38 38 weeks gestation of pregnancy: Secondary | ICD-10-CM

## 2020-07-08 LAB — CBC
HCT: 38.3 % (ref 36.0–46.0)
Hemoglobin: 12.6 g/dL (ref 12.0–15.0)
MCH: 28.7 pg (ref 26.0–34.0)
MCHC: 32.9 g/dL (ref 30.0–36.0)
MCV: 87.2 fL (ref 80.0–100.0)
Platelets: 222 10*3/uL (ref 150–400)
RBC: 4.39 MIL/uL (ref 3.87–5.11)
RDW: 13.3 % (ref 11.5–15.5)
WBC: 8.8 10*3/uL (ref 4.0–10.5)
nRBC: 0 % (ref 0.0–0.2)

## 2020-07-08 LAB — TYPE AND SCREEN
ABO/RH(D): O POS
Antibody Screen: NEGATIVE

## 2020-07-08 LAB — RPR: RPR Ser Ql: NONREACTIVE

## 2020-07-08 MED ORDER — MISOPROSTOL 50MCG HALF TABLET
50.0000 ug | ORAL_TABLET | ORAL | Status: DC
  Administered 2020-07-08 (×2): 50 ug via ORAL
  Filled 2020-07-08: qty 1

## 2020-07-08 MED ORDER — FENTANYL CITRATE (PF) 100 MCG/2ML IJ SOLN
100.0000 ug | INTRAMUSCULAR | Status: DC | PRN
  Administered 2020-07-08 – 2020-07-09 (×3): 100 ug via INTRAVENOUS
  Filled 2020-07-08 (×3): qty 2

## 2020-07-08 MED ORDER — OXYCODONE-ACETAMINOPHEN 5-325 MG PO TABS: 1.0000 | ORAL_TABLET | ORAL | Status: DC | PRN

## 2020-07-08 MED ORDER — TERBUTALINE SULFATE 1 MG/ML IJ SOLN: 0.2500 mg | Freq: Once | INTRAMUSCULAR | Status: DC | PRN

## 2020-07-08 MED ORDER — LACTATED RINGERS IV SOLN: INTRAVENOUS | Status: DC

## 2020-07-08 MED ORDER — ACETAMINOPHEN 325 MG PO TABS: 650.0000 mg | ORAL_TABLET | ORAL | Status: DC | PRN

## 2020-07-08 MED ORDER — OXYTOCIN BOLUS FROM INFUSION
333.0000 mL | Freq: Once | INTRAVENOUS | Status: AC
  Administered 2020-07-09: 333 mL via INTRAVENOUS

## 2020-07-08 MED ORDER — MISOPROSTOL 25 MCG QUARTER TABLET: 25.0000 ug | ORAL_TABLET | ORAL | Status: DC | PRN

## 2020-07-08 MED ORDER — MISOPROSTOL 50MCG HALF TABLET
ORAL_TABLET | ORAL | Status: AC
  Filled 2020-07-08: qty 1

## 2020-07-08 MED ORDER — OXYCODONE-ACETAMINOPHEN 5-325 MG PO TABS: 2.0000 | ORAL_TABLET | ORAL | Status: DC | PRN

## 2020-07-08 MED ORDER — LIDOCAINE HCL (PF) 1 % IJ SOLN: 30.0000 mL | INTRAMUSCULAR | Status: DC | PRN

## 2020-07-08 MED ORDER — FENTANYL CITRATE (PF) 100 MCG/2ML IJ SOLN
INTRAMUSCULAR | Status: AC
  Administered 2020-07-09: 100 ug via INTRAVENOUS
  Filled 2020-07-08: qty 2

## 2020-07-08 MED ORDER — SOD CITRATE-CITRIC ACID 500-334 MG/5ML PO SOLN: 30.0000 mL | ORAL | Status: DC | PRN

## 2020-07-08 MED ORDER — FLEET ENEMA 7-19 GM/118ML RE ENEM: 1.0000 | ENEMA | RECTAL | Status: DC | PRN

## 2020-07-08 MED ORDER — ONDANSETRON HCL 4 MG/2ML IJ SOLN: 4.0000 mg | Freq: Four times a day (QID) | INTRAMUSCULAR | Status: DC | PRN

## 2020-07-08 MED ORDER — OXYTOCIN-SODIUM CHLORIDE 30-0.9 UT/500ML-% IV SOLN
2.5000 [IU]/h | INTRAVENOUS | Status: DC
  Filled 2020-07-08 (×2): qty 500

## 2020-07-08 MED ORDER — OXYTOCIN-SODIUM CHLORIDE 30-0.9 UT/500ML-% IV SOLN
1.0000 m[IU]/min | INTRAVENOUS | Status: DC
  Administered 2020-07-08: 2 m[IU]/min via INTRAVENOUS
  Administered 2020-07-08: 8 m[IU]/min via INTRAVENOUS
  Administered 2020-07-08: 6 m[IU]/min via INTRAVENOUS

## 2020-07-08 MED ORDER — LACTATED RINGERS IV SOLN
500.0000 mL | INTRAVENOUS | Status: DC | PRN
  Administered 2020-07-09: 500 mL via INTRAVENOUS

## 2020-07-08 NOTE — Progress Notes (Signed)
Nicole Noble is a 34 y.o. [id number] at [redacted]w[redacted]d by LMP admitted for induction of labor due to polyhydramnios.  Subjective: Pt using birth ball and ambulating in room, tolerating occasional contractions well with FOB at bedside for support.  Objective: BP 114/78   Pulse 78   Temp 98.2 F (36.8 C) (Oral)   Resp 18   LMP 09/24/2019  No intake/output data recorded. No intake/output data recorded.  FHT:  FHR: 135 bpm, variability: moderate,  accelerations:  Present,  decelerations:  Absent UC:   irregular, every 5-8 minutes SVE:   Dilation: 2.5 Effacement (%): 60 Station: -2 Exam by:: H.Price, RN  Labs: Lab Results  Component Value Date   WBC 8.8 07/08/2020   HGB 12.6 07/08/2020   HCT 38.3 07/08/2020   MCV 87.2 07/08/2020   PLT 222 07/08/2020    Assessment / Plan: Induction of labor due to polyhydramnios,  progressing well on pitocin  Labor: Progressing on cytotec, 2nd dose given, will begin Pitocin titration at 1815 Preeclampsia:  no signs or symptoms of toxicity Fetal Wellbeing:  Category I Pain Control:  Labor support without medications I/D:  n/a Anticipated MOD:  NSVD  Edd Arbour, CNM, MSN, [facility] 07/08/20 5:31 PM

## 2020-07-08 NOTE — Progress Notes (Signed)
Introduced self to patient and discussed plan of care  Patient is interested in waterbirth, discussed with patient that she will have to start making cervical change on own without need for pitocin to move forward with waterbirth, patient verbalizes understanding.   Answered additional questions of patient's. Patient breathing through contractions and request pain medication at this time- IV Fentanyl ordered.   Vitals:   07/08/20 1930 07/08/20 2002  BP: 133/87 122/73  Pulse: 73 66  Resp: 16 17  Temp:     Dilation: 2.5 Effacement (%): 60 Cervical Position: Middle Station: -2 Presentation: Vertex Exam by:: H.Price, RN  Cat I tracing  Will recheck cervix around 2200 for AROM, cut off pitocin and reassess for SOL around 0000.  Patient agrees with plan of care.   Sharyon Cable, CNM 07/08/20, 8:57 PM

## 2020-07-08 NOTE — H&P (Addendum)
Nicole Noble is a 34 y.o. [id number] at [redacted]w[redacted]d who presents for IOL for polyhydramnios. She has no history of GDM or infectious disease. She desires an unmedicated waterbirth if possible, but is amenable to other pain relief options if needed.   OB History    Gravida  4   Para  2   Term  2   Preterm      AB  1   Living  2     SAB  1   TAB      Ectopic      Multiple      Live Births           Obstetric Comments  Daughter had jaundice, RSV in neonatal period.       Past Medical History:  Diagnosis Date  . Arthritis   . Asthma   . Depression   . Hx gestational diabetes   . Migraines    Past Surgical History:  Procedure Laterality Date  . NO PAST SURGERIES     Family History: family history includes Arthritis in her maternal grandmother; Throat cancer in her father. Social History:  reports that she has never smoked. She has never used smokeless tobacco. She reports previous alcohol use. She reports that she does not use drugs.     Maternal Diabetes: No Genetic Screening: Declined Maternal Ultrasounds/Referrals: Normal Fetal Ultrasounds or other Referrals:  None Maternal Substance Abuse:  No Significant Maternal Medications:  None Significant Maternal Lab Results:  Group B Strep negative Other Comments:  None  Review of Systems  Musculoskeletal: Positive for back pain (low back pain just in the last few weeks).  All other systems reviewed and are negative.  Maternal Medical History:  Reason for admission: Here for IOL for polyhydramnios, no contractions, LOF or VB  Fetal activity: Perceived fetal activity is normal.   Last perceived fetal movement was within the past hour.    Prenatal complications: Polyhydramnios.   Prenatal Complications - Diabetes: none.    Dilation: 2 Effacement (%): 50 Station: -3 Exam by:: [employer], RN Blood pressure 118/69, pulse 84, temperature 98 F (36.7 C), temperature source Oral, resp. rate 18, last menstrual  period 09/24/2019. Maternal Exam:  Abdomen: Patient reports no abdominal tenderness. Fetal presentation: vertex  Introitus: Normal vulva. Normal vagina.  Pelvis: adequate for delivery.   Vertex presentation confirmed by bedside ultrasound performed by Donette Larry, CNM Cervix: Cervix evaluated by digital exam.     Fetal Exam Fetal Monitor Review: Mode: ultrasound.   Baseline rate: 135.  Variability: moderate (6-25 bpm).   Pattern: accelerations present and no decelerations.    Fetal State Assessment: Category I - tracings are normal.     Physical Exam Vitals and nursing note reviewed.  Constitutional:      Appearance: Normal appearance. She is normal weight.  HENT:     Head: Normocephalic.     Nose: Nose normal.     Mouth/Throat:     Mouth: Mucous membranes are moist.  Eyes:     Pupils: Pupils are equal, round, and reactive to light.  Cardiovascular:     Rate and Rhythm: Normal rate and regular rhythm.     Pulses: Normal pulses.     Heart sounds: Normal heart sounds.  Pulmonary:     Effort: Pulmonary effort is normal.     Breath sounds: Normal breath sounds.  Abdominal:     General: Bowel sounds are normal.  Genitourinary:    General: Normal vulva.  Musculoskeletal:  General: Normal range of motion.     Cervical back: Normal range of motion.  Skin:    General: Skin is warm and dry.     Capillary Refill: Capillary refill takes less than 2 seconds.  Neurological:     Mental Status: She is alert and oriented to person, place, and time.  Psychiatric:        Mood and Affect: Mood normal.        Behavior: Behavior normal.        Thought Content: Thought content normal.        Judgment: Judgment normal.     Prenatal labs: ABO, Rh: --/--/O POS (08/10 2993) Antibody: NEG (08/10 0833) Rubella:  immune RPR: NON REACTIVE (08/10 0832)  HBsAg:   negative HIV: NON-REACTIVE (06/15 0834)  GBS: Negative/-- (07/30 7169)   Assessment/Plan: IOL for polydramnios  at [redacted]w[redacted]d Begin induction with buccal cytotec, will progress to Pitocin and possible AROM  Edd Arbour, CNM, MSN, [facility] 07/08/20 11:49 AM

## 2020-07-09 ENCOUNTER — Inpatient Hospital Stay (HOSPITAL_COMMUNITY): Payer: 59 | Admitting: Anesthesiology

## 2020-07-09 ENCOUNTER — Encounter (HOSPITAL_COMMUNITY): Payer: Self-pay | Admitting: Family Medicine

## 2020-07-09 DIAGNOSIS — O403XX0 Polyhydramnios, third trimester, not applicable or unspecified: Secondary | ICD-10-CM

## 2020-07-09 DIAGNOSIS — Z3A39 39 weeks gestation of pregnancy: Secondary | ICD-10-CM

## 2020-07-09 MED ORDER — EPHEDRINE 5 MG/ML INJ: 10.0000 mg | INTRAVENOUS | Status: DC | PRN

## 2020-07-09 MED ORDER — DIPHENHYDRAMINE HCL 25 MG PO CAPS: 25.0000 mg | ORAL_CAPSULE | Freq: Four times a day (QID) | ORAL | Status: DC | PRN

## 2020-07-09 MED ORDER — PHENYLEPHRINE 40 MCG/ML (10ML) SYRINGE FOR IV PUSH (FOR BLOOD PRESSURE SUPPORT): 80.0000 ug | PREFILLED_SYRINGE | INTRAVENOUS | Status: DC | PRN

## 2020-07-09 MED ORDER — WITCH HAZEL-GLYCERIN EX PADS: 1.0000 "application " | MEDICATED_PAD | CUTANEOUS | Status: DC | PRN

## 2020-07-09 MED ORDER — FENTANYL-BUPIVACAINE-NACL 0.5-0.125-0.9 MG/250ML-% EP SOLN
12.0000 mL/h | EPIDURAL | Status: DC | PRN
  Filled 2020-07-09: qty 250

## 2020-07-09 MED ORDER — DIPHENHYDRAMINE HCL 50 MG/ML IJ SOLN: 12.5000 mg | INTRAMUSCULAR | Status: DC | PRN

## 2020-07-09 MED ORDER — LIDOCAINE HCL (PF) 1 % IJ SOLN
INTRAMUSCULAR | Status: DC | PRN
  Administered 2020-07-09: 5 mL via EPIDURAL
  Administered 2020-07-09: 4 mL via EPIDURAL

## 2020-07-09 MED ORDER — COCONUT OIL OIL: 1.0000 "application " | TOPICAL_OIL | Status: DC | PRN

## 2020-07-09 MED ORDER — ACETAMINOPHEN 325 MG PO TABS
650.0000 mg | ORAL_TABLET | ORAL | Status: DC | PRN
  Administered 2020-07-09: 650 mg via ORAL
  Filled 2020-07-09: qty 2

## 2020-07-09 MED ORDER — PRENATAL MULTIVITAMIN CH
1.0000 | ORAL_TABLET | Freq: Every day | ORAL | Status: DC
  Administered 2020-07-10: 1 via ORAL
  Filled 2020-07-09: qty 1

## 2020-07-09 MED ORDER — ONDANSETRON HCL 4 MG/2ML IJ SOLN: 4.0000 mg | INTRAMUSCULAR | Status: DC | PRN

## 2020-07-09 MED ORDER — IBUPROFEN 600 MG PO TABS
600.0000 mg | ORAL_TABLET | Freq: Four times a day (QID) | ORAL | Status: DC
  Administered 2020-07-09 – 2020-07-10 (×4): 600 mg via ORAL
  Filled 2020-07-09 (×4): qty 1

## 2020-07-09 MED ORDER — ZOLPIDEM TARTRATE 5 MG PO TABS: 5.0000 mg | ORAL_TABLET | Freq: Every evening | ORAL | Status: DC | PRN

## 2020-07-09 MED ORDER — MEASLES, MUMPS & RUBELLA VAC IJ SOLR: 0.5000 mL | Freq: Once | INTRAMUSCULAR | Status: DC

## 2020-07-09 MED ORDER — SODIUM CHLORIDE (PF) 0.9 % IJ SOLN
INTRAMUSCULAR | Status: DC | PRN
  Administered 2020-07-09: 11 mL/h via EPIDURAL

## 2020-07-09 MED ORDER — SIMETHICONE 80 MG PO CHEW: 80.0000 mg | CHEWABLE_TABLET | ORAL | Status: DC | PRN

## 2020-07-09 MED ORDER — SENNOSIDES-DOCUSATE SODIUM 8.6-50 MG PO TABS
2.0000 | ORAL_TABLET | ORAL | Status: DC
  Administered 2020-07-09: 2 via ORAL
  Filled 2020-07-09: qty 2

## 2020-07-09 MED ORDER — LACTATED RINGERS IV SOLN
500.0000 mL | Freq: Once | INTRAVENOUS | Status: AC
  Administered 2020-07-09: 500 mL via INTRAVENOUS

## 2020-07-09 MED ORDER — BENZOCAINE-MENTHOL 20-0.5 % EX AERO
1.0000 "application " | INHALATION_SPRAY | CUTANEOUS | Status: DC | PRN
  Administered 2020-07-09: 1 via TOPICAL
  Filled 2020-07-09: qty 56

## 2020-07-09 MED ORDER — DIBUCAINE (PERIANAL) 1 % EX OINT: 1.0000 "application " | TOPICAL_OINTMENT | CUTANEOUS | Status: DC | PRN

## 2020-07-09 MED ORDER — TETANUS-DIPHTH-ACELL PERTUSSIS 5-2.5-18.5 LF-MCG/0.5 IM SUSP: 0.5000 mL | Freq: Once | INTRAMUSCULAR | Status: DC

## 2020-07-09 MED ORDER — ONDANSETRON HCL 4 MG PO TABS: 4.0000 mg | ORAL_TABLET | ORAL | Status: DC | PRN

## 2020-07-09 NOTE — Progress Notes (Signed)
LABOR PROGRESS NOTE  Nicole Noble is a 34 y.o. 32 at [redacted]w[redacted]d  admitted for IOL for polyhydramnios   Subjective: Patient doing well, breathing through contractions, patient reports she recently woke up from nap   Objective: BP 112/74   Pulse 92   Temp 97.9 F (36.6 C) (Oral)   Resp 17   LMP 09/24/2019  or  Vitals:   07/08/20 2131 07/08/20 2201 07/08/20 2302 07/08/20 2330  BP: 110/71 100/70 112/73 112/74  Pulse: 68 73 68 92  Resp: 17 16 17    Temp:      TempSrc:        Dilation: 4 Effacement (%): 60 Cervical Position: Middle Station: Ballotable Presentation: Vertex Exam by:: [email or web address] CNM FHT: baseline rate 120, moderate varibility, +accel, no decel Toco: 2-3  Labs: Lab Results  Component Value Date   WBC 8.8 07/08/2020   HGB 12.6 07/08/2020   HCT 38.3 07/08/2020   MCV 87.2 07/08/2020   PLT 222 07/08/2020    Patient Active Problem List   Diagnosis Date Noted  . Polyhydramnios affecting pregnancy 07/08/2020  . Polyhydramnios 06/20/2020  . Supervision of other normal pregnancy, antepartum 04/22/2020    Assessment / Plan: 34 y.o. 38 at [redacted]w[redacted]d here for IOL for polyhydramnios   Labor: Unable to AROM at this time d/t fetus ballotable, will continue with pitocin titration and try for AROM at next cervical examination around 0300.  Fetal Wellbeing:  Cat I  Pain Control:  IV Fentanyl  Anticipated MOD:  SVD  [redacted]w[redacted]d, CNM 07/09/2020, 12:42 AM

## 2020-07-09 NOTE — Progress Notes (Signed)
Labor Progress Note Nicole Noble is a 34 y.o. G9M2111 at [redacted]w[redacted]d presented for IOL for polyhydramnios.  S: Doing well, previously feeling increasing pain with contractions but now well-controlled with fentanyl.  O:  BP 112/66   Pulse 73   Temp 97.9 F (36.6 C)   Resp 17   LMP 09/24/2019  EFM: 130/mod variability/+ accels/no decels  CVE: Dilation: 4.5 Effacement (%): 70 Cervical Position: Middle Station: Ballotable Presentation: Vertex Exam by:: Dr. Maryagnes Amos   A&P: 34 y.o. B5M0802 [redacted]w[redacted]d presenting for IOL for polyhydramnios. #Labor: Progressing well. Bulging membranes on last check but fetal head remains somewhat ballotable, will consider AROM at next check if head better applied. Pitocin at 16. #Pain: Per patient request, s/p fentanyl  #FWB: Cat I #GBS negative   Roma Kayser, MD 4:27 AM

## 2020-07-09 NOTE — Anesthesia Preprocedure Evaluation (Signed)
Anesthesia Evaluation  Patient identified by MRN, date of birth, ID band Patient awake    Reviewed: Allergy & Precautions, Patient's Chart, lab work & pertinent test results  Airway Mallampati: II  TM Distance: >3 FB Neck ROM: Full    Dental no notable dental hx.    Pulmonary asthma ,    Pulmonary exam normal breath sounds clear to auscultation       Cardiovascular negative cardio ROS Normal cardiovascular exam Rhythm:Regular Rate:Normal     Neuro/Psych  Headaches, PSYCHIATRIC DISORDERS Depression    GI/Hepatic Neg liver ROS, GERD  ,  Endo/Other  Obesity  Renal/GU negative Renal ROS  negative genitourinary   Musculoskeletal  (+) Arthritis ,   Abdominal (+) + obese,   Peds  Hematology negative hematology ROS (+)   Anesthesia Other Findings   Reproductive/Obstetrics                             Anesthesia Physical Anesthesia Plan  ASA: II  Anesthesia Plan: Epidural   Post-op Pain Management:    Induction:   PONV Risk Score and Plan:   Airway Management Planned: Natural Airway  Additional Equipment:   Intra-op Plan:   Post-operative Plan:   Informed Consent: I have reviewed the patients History and Physical, chart, labs and discussed the procedure including the risks, benefits and alternatives for the proposed anesthesia with the patient or authorized representative who has indicated his/her understanding and acceptance.       Plan Discussed with: CRNA and Anesthesiologist  Anesthesia Plan Comments:         Anesthesia Quick Evaluation

## 2020-07-09 NOTE — Anesthesia Procedure Notes (Signed)
Epidural Patient location during procedure: OB Start time: 07/09/2020 8:37 AM  Staffing Anesthesiologist: Mal Amabile, MD Performed: anesthesiologist   Preanesthetic Checklist Completed: patient identified, IV checked, site marked, risks and benefits discussed, surgical consent, monitors and equipment checked, pre-op evaluation and timeout performed  Epidural Patient position: sitting Prep: DuraPrep and site prepped and draped Patient monitoring: continuous pulse ox and blood pressure Approach: midline Location: L3-L4 Injection technique: LOR saline  Needle:  Needle type: Tuohy  Needle gauge: 17 G Needle length: 9 cm and 9 Needle insertion depth: 4 cm Catheter type: closed end flexible Catheter size: 19 Gauge Catheter at skin depth: 9 cm Test dose: negative and Other  Assessment Events: blood not aspirated, injection not painful, no injection resistance, no paresthesia and negative IV test  Additional Notes Patient identified. Risks and benefits discussed including failed block, incomplete  Pain control, post dural puncture headache, nerve damage, paralysis, blood pressure Changes, nausea, vomiting, reactions to medications-both toxic and allergic and post Partum back pain. All questions were answered. Patient expressed understanding and wished to proceed. Sterile technique was used throughout procedure. Epidural site was Dressed with sterile barrier dressing. No paresthesias, signs of intravascular injection Or signs of intrathecal spread were encountered.  Patient was more comfortable after the epidural was dosed. Please see RN's note for documentation of vital signs and FHR which are stable. Reason for block:procedure for pain

## 2020-07-09 NOTE — Discharge Summary (Signed)
Postpartum Discharge Summary       Patient Name: Nicole Noble DOB: 09-27-1986 MRN: [id number]  Date of admission: 07/08/2020 Delivery date:07/09/2020  Delivering provider: Nicolette Bang  Date of discharge: 07/10/2020  Admitting diagnosis: Polyhydramnios affecting pregnancy [O40.9XX0] Intrauterine pregnancy: [redacted]w[redacted]d    Secondary diagnosis:  Active Problems:   Supervision of other normal pregnancy, antepartum   Polyhydramnios   Polyhydramnios affecting pregnancy   SVD (spontaneous vaginal delivery)  Additional problems: None    Discharge diagnosis: Term Pregnancy Delivered                                              Post partum procedures: None Augmentation: AROM, Pitocin and Cytotec Complications: None  Hospital course: Induction of Labor With Vaginal Delivery   34y.o. yo [id number] admitted to the hospital 07/08/2020 for induction of labor.  Indication for induction: Polyhydramnios .  Patient had an uncomplicated labor course as follows: Membrane Rupture Time/Date: 10:18 AM ,07/09/2020   Delivery Method:Vaginal, Spontaneous  Episiotomy: None  Lacerations:  1st degree;Perineal  Details of delivery can be found in separate delivery note.  Patient had a routine postpartum course. Patient is discharged home 07/10/20.  Newborn Data: Birth date:07/09/2020  Birth time:11:27 AM  Gender:Female  Living status:Living  Apgars:9 ,9  Weight:4170 g   Magnesium Sulfate received: No BMZ received: No Rhophylac:N/A MMR:N/A T-DaP:Given prenatally Flu: N/A Transfusion:No  Physical exam  Vitals:   07/09/20 1500 07/09/20 1849 07/09/20 2300 07/10/20 0300  BP: 103/70 102/86 105/77 112/79  Pulse: 88 73 65 71  Resp: _0 Temp: 98.6 F (37 C) 98.1 F (36.7 C) 97.8 F (36.6 C) 98.2 F (36.8 C)  TempSrc:  Oral Oral Oral  SpO2: 97% 97% 97% 97%   General: alert, cooperative and no distress Lochia: appropriate Uterine Fundus: firm Incision: N/A DVT  Evaluation: No evidence of DVT seen on physical exam. Labs: Lab Results  Component Value Date   WBC 8.8 07/08/2020   HGB 12.6 07/08/2020   HCT 38.3 07/08/2020   MCV 87.2 07/08/2020   PLT 222 07/08/2020   No flowsheet data found. Edinburgh Score: Edinburgh Postnatal Depression Scale Screening Tool 07/09/2020  I have been able to laugh and see the funny side of things. 0  I have looked forward with enjoyment to things. 0  I have blamed myself unnecessarily when things went wrong. 0  I have been anxious or worried for no good reason. 0  I have felt scared or panicky for no good reason. 0  Things have been getting on top of me. 0  I have been so unhappy that I have had difficulty sleeping. 0  I have felt sad or miserable. 0  I have been so unhappy that I have been crying. 0  The thought of harming myself has occurred to me. 0  Edinburgh Postnatal Depression Scale Total 0     After visit meds:  Allergies as of 07/10/2020      Reactions   Penicillins Anaphylaxis   Sulfa Antibiotics Nausea And Vomiting      Medication List    STOP taking these medications   aspirin EC 81 MG tablet   Misc. Devices Misc     TAKE these medications   acetaminophen 325 MG tablet Commonly known as: TYLENOL Take 650 mg by  mouth every 6 (six) hours as needed for mild pain or headache.   ibuprofen 600 MG tablet Commonly known as: ADVIL Take 1 tablet (600 mg total) by mouth every 6 (six) hours.   norethindrone 0.35 MG tablet Commonly known as: Ortho Micronor Take 1 tablet (0.35 mg total) by mouth daily.   prenatal multivitamin Tabs tablet Take 1 tablet by mouth daily at 12 noon.        Discharge home in stable condition Infant Feeding: Breast Infant Disposition:home with mother Discharge instruction: per After Visit Summary and Postpartum booklet. Activity: Advance as tolerated. Pelvic rest for 6 weeks.  Diet: routine diet Future Appointments:No future appointments. Follow up  Visit:   Please schedule this patient for a In person postpartum visit in 4 weeks with the following provider: Any provider. Additional Postpartum F/U:None  High risk pregnancy complicated by: Poly Delivery mode:  Vaginal, Spontaneous  Anticipated Birth Control:  POPs   07/10/2020 Nicole Noble L Amonie Wisser, DO

## 2020-07-09 NOTE — Progress Notes (Signed)
LABOR PROGRESS NOTE  Nicole Noble is a 34 y.o. [id number] at [redacted]w[redacted]d  admitted for IOL for polyhydramnios.   Subjective: Comfortable with epidural. FOB at bedside.   Objective: BP 114/72    Pulse 79    Temp 98.2 F (36.8 C) (Oral)    Resp 18    LMP 09/24/2019    SpO2 97%  or  Vitals:   07/09/20 0906 07/09/20 0931 07/09/20 1001 07/09/20 1006  BP: 110/66 106/71 114/72   Pulse: 71 79 79   Resp:      Temp: 98.2 F (36.8 C)     TempSrc: Oral     SpO2:   94% 97%    Dilation: 9 Effacement (%): 100 Cervical Position: Middle Station: [place], -2 Presentation: Vertex Exam by:: CEarlene Plater FHT: baseline rate 150, moderate varibility, +acel, variable decel after AROM  Toco: q2-3 min   Labs: Lab Results  Component Value Date   WBC 8.8 07/08/2020   HGB 12.6 07/08/2020   HCT 38.3 07/08/2020   MCV 87.2 07/08/2020   PLT 222 07/08/2020    Patient Active Problem List   Diagnosis Date Noted   Polyhydramnios affecting pregnancy 07/08/2020   Polyhydramnios 06/20/2020   Supervision of other normal pregnancy, antepartum 04/22/2020    Assessment / Plan: 34 y.o. Nicole Noble at [redacted]w[redacted]d here for IOL for polyhydramnios.   Labor: On pitocin at 20 mu/min. AROM with FSE performed with copious amounts of clear fluid. Fetal head now well applied to cervix.  Fetal Wellbeing:  Cat I overall  Pain Control:  Epidural in place  Anticipated MOD:  NSVD  Marcy Siren, D.O. OB Fellow  07/09/2020, 10:34 AM

## 2020-07-10 ENCOUNTER — Encounter (HOSPITAL_COMMUNITY): Payer: Self-pay | Admitting: *Deleted

## 2020-07-10 MED ORDER — NORETHINDRONE 0.35 MG PO TABS
1.0000 | ORAL_TABLET | Freq: Every day | ORAL | 11 refills | Status: AC
Start: 2020-07-10 — End: 2021-07-10

## 2020-07-10 MED ORDER — IBUPROFEN 600 MG PO TABS: 600.0000 mg | ORAL_TABLET | Freq: Four times a day (QID) | ORAL | 0 refills | Status: AC

## 2020-07-10 NOTE — Plan of Care (Signed)
  Problem: Education: Goal: Knowledge of condition will improve Outcome: Progressing   Problem: Activity: Goal: Will verbalize the importance of balancing activity with adequate rest periods Outcome: Progressing Goal: Ability to tolerate increased activity will improve Outcome: Progressing   Problem: Coping: Goal: Ability to identify and utilize available resources and services will improve Outcome: Progressing   Problem: Life Cycle: Goal: Chance of risk for complications during the postpartum period will decrease Outcome: Progressing   Problem: Role Relationship: Goal: Ability to demonstrate positive interaction with newborn will improve Outcome: Progressing   

## 2020-07-10 NOTE — Anesthesia Postprocedure Evaluation (Signed)
Anesthesia Post Note  Patient: Nicole Noble  Procedure(s) Performed: AN AD HOC LABOR EPIDURAL     Patient location during evaluation: Mother Baby Anesthesia Type: Epidural Level of consciousness: awake and alert Pain management: pain level controlled Vital Signs Assessment: post-procedure vital signs reviewed and stable Respiratory status: spontaneous breathing, nonlabored ventilation and respiratory function stable Cardiovascular status: stable Postop Assessment: no headache, no backache and epidural receding Anesthetic complications: no   No complications documented.  Last Vitals:  Vitals:   07/09/20 2300 07/10/20 0300  BP: 105/77 112/79  Pulse: 65 71  Resp: 18 18  Temp: 36.6 C 36.8 C  SpO2: 97% 97%    Last Pain:  Vitals:   07/10/20 0300  TempSrc: Oral  PainSc:    Pain Goal:                   [employer]

## 2020-07-10 NOTE — Progress Notes (Signed)
CSW received consult for hx of Depression.  CSW met with MOB to offer support and complete assessment.    CSW congratulated MOB and FOB on the birth of infant. CSW advised MOB of CSW's role and the reason for CSW coming to visit with her. MOB reported that she was diagnosed with depression in 2018. MOB reported that she has a hx of medication use to help manage depression, however not taking any medications at this time. MOB reported that her depression has been minimal and reported no interest in resources at this time. MOB expressed to CSW that she has been feeling well since the birth of infant. 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 CSW took this time to provide education regarding the baby blues period vs. perinatal mood disorders, discussed treatment and gave resources for mental health follow up if concerns arise.  CSW recommends self-evaluation during the postpartum time period using the New Mom Checklist from Postpartum Progress and encouraged MOB to contact a medical professional if symptoms are noted at any time.    CSW provided review of Sudden Infant Death Syndrome (SIDS) precautions.  MOB reported that infant would sleep in crib once arrived home. MOB reported that she has all essential items needed to care for infant with no other needs. MOB reported that her supports include her family and few friends.    CSW identifies no further need for intervention and no barriers to discharge at this time.  Nicole Noble, MSW, LCSW [facility] [place] [phone]   

## 2020-08-06 ENCOUNTER — Telehealth (INDEPENDENT_AMBULATORY_CARE_PROVIDER_SITE_OTHER): Payer: 59 | Admitting: Obstetrics and Gynecology

## 2020-08-06 ENCOUNTER — Encounter: Payer: Self-pay | Admitting: Obstetrics and Gynecology

## 2020-08-06 DIAGNOSIS — Z3480 Encounter for supervision of other normal pregnancy, unspecified trimester: Secondary | ICD-10-CM

## 2020-08-06 NOTE — Progress Notes (Signed)
I connected with@ on 08/06/20 at  9:30 AM EDT by: Mychart video and verified that I am speaking with the correct person using two identifiers.  Patient is located at home and provider is located at [employer] [facility] [employer] at [place] .     The purpose of this virtual visit is to provide medical care while limiting exposure to the novel coronavirus. I discussed the limitations, risks, security and privacy concerns of performing an evaluation and management service by virtual video visit  and the availability of in person appointments. I also discussed with the patient that there may be a patient responsible charge related to this service. By engaging in this virtual visit, you consent to the provision of healthcare.  Additionally, you authorize for your insurance to be billed for the services provided during this visit.  The patient expressed understanding and agreed to proceed.  The following staff members participated in the virtual visit: Venia Carbon NP   Post Partum Visit Note Subjective:   Nicole Noble is a 34 y.o. [phone] female being evaluated for postpartum followup.  She is 4 weeks postpartum following a normal spontaneous vaginal delivery at  39 gestational weeks.  I have fully reviewed the prenatal and intrapartum course; pregnancy complicated by n/a.  Postpartum course has been unremarkable. Baby is doing well. Baby is feeding by breast. Bleeding staining only. Bowel function is normal. Bladder function is normal. Patient is not sexually active. Contraception method is oral progesterone-only contraceptive. Postpartum depression screening: negative.   The pregnancy intention screening data noted above was reviewed. Potential methods of contraception were discussed. The patient elected to proceed with Oral Contraceptive.   The following portions of the patient's history were reviewed and updated as appropriate: allergies, current medications, past family history, past  medical history, past social history, past surgical history and problem list.  Review of Systems Pertinent items are noted in HPI.   Objective:  There were no vitals filed for this visit. Self-Obtained Patient does not have BP cuff at home.        Assessment:   Normal postpartum exam. Has Rx for progesterone pills   Plan:  Essential components of care per ACOG recommendations:  1.  Mood and well being: Patient with negative depression screening today. Reviewed local resources for support.  - Patient does not use tobacco. If using tobacco we discussed reduction and for recently cessation risk of relapse - hx of drug use? No    2. Infant care and feeding:  -Patient currently breastmilk feeding? Yes If breastmilk feeding discussed return to work and pumping. If needed, patient was provided letter for work to allow for every 2-3 hr pumping breaks, and to be granted a private location to express breastmilk and refrigerated area to store breastmilk. Reviewed importance of draining breast regularly to support lactation. -Social determinants of health (SDOH) reviewed in EPIC. No concerns.   3. Sexuality, contraception and birth spacing - Patient does not want a pregnancy in the next year.    - Reviewed forms of contraception in tiered fashion. Patient desired oral progesterone-only contraceptive today.   - Discussed birth spacing of 18 months  4. Sleep and fatigue -Encouraged family/partner/community support of 4 hrs of uninterrupted sleep to help with mood and fatigue  5. Physical Recovery  - Discussed patients delivery nd complications - Patient had a 1st degree laceration, perineal healing reviewed. Patient expressed understanding - Patient has urinary incontinence? No Patient was referred to pelvic floor PT  -  Patient is not safe to resume physical and sexual activity  6.  Health Maintenance - Last pap smear done 02/18/20 and was normal with negative HPV.  7. Chronic Disease -  PCP follow up  11 minutes of non-face-to-face time spent with the patient    Venia Carbon, NP [facility] [employer]
